# Patient Record
Sex: Male | Born: 1970 | Race: White | Hispanic: No | Marital: Single | Smoking: Current every day smoker
Health system: Southern US, Community
[De-identification: ages and names within clinical notes are randomized; demographics above are authoritative.]

## PROBLEM LIST (undated history)

## (undated) DIAGNOSIS — F419 Anxiety disorder, unspecified: Secondary | ICD-10-CM

## (undated) DIAGNOSIS — R002 Palpitations: Secondary | ICD-10-CM

## (undated) HISTORY — DX: Anxiety disorder, unspecified: F41.9

## (undated) HISTORY — PX: FRACTURE SURGERY: SHX138

## (undated) HISTORY — PX: NO PAST SURGERIES: SHX2092

---

## 2004-03-16 ENCOUNTER — Emergency Department (HOSPITAL_COMMUNITY): Admission: EM | Admit: 2004-03-16 | Discharge: 2004-03-16 | Payer: Self-pay | Admitting: Emergency Medicine

## 2010-08-18 ENCOUNTER — Other Ambulatory Visit: Payer: Self-pay | Admitting: Emergency Medicine

## 2010-08-18 ENCOUNTER — Emergency Department (HOSPITAL_COMMUNITY)
Admission: EM | Admit: 2010-08-18 | Discharge: 2010-08-18 | Disposition: A | Discharge status: Home / Self Care | Payer: Self-pay | Attending: Emergency Medicine | Admitting: Emergency Medicine

## 2010-08-18 DIAGNOSIS — F1010 Alcohol abuse, uncomplicated: Secondary | ICD-10-CM | POA: Insufficient documentation

## 2010-08-18 LAB — CBC
HCT: 44.4 % (ref 39.0–52.0)
Hemoglobin: 15.1 g/dL (ref 13.0–17.0)
MCH: 30.8 pg (ref 26.0–34.0)
MCHC: 34 g/dL (ref 30.0–36.0)
MCV: 90.6 fL (ref 78.0–100.0)
RDW: 12.7 % (ref 11.5–15.5)

## 2010-08-18 LAB — DIFFERENTIAL
Basophils Absolute: 0 10*3/uL (ref 0.0–0.1)
Eosinophils Relative: 1 % (ref 0–5)
Lymphocytes Relative: 22 % (ref 12–46)
Lymphs Abs: 2.7 10*3/uL (ref 0.7–4.0)
Monocytes Absolute: 0.8 10*3/uL (ref 0.1–1.0)
Monocytes Relative: 6 % (ref 3–12)

## 2010-08-18 LAB — COMPREHENSIVE METABOLIC PANEL
BUN: 13 mg/dL (ref 6–23)
CO2: 24 mEq/L (ref 19–32)
Calcium: 8.2 mg/dL — ABNORMAL LOW (ref 8.4–10.5)
Chloride: 104 mEq/L (ref 96–112)
Creatinine, Ser: 0.98 mg/dL (ref 0.4–1.5)
GFR calc non Af Amer: >60 mL/min (ref >60)
Glucose, Bld: 110 mg/dL — ABNORMAL HIGH (ref 70–99)
Total Bilirubin: 0.9 mg/dL (ref 0.3–1.2)

## 2011-02-17 ENCOUNTER — Emergency Department (HOSPITAL_COMMUNITY)
Admission: EM | Admit: 2011-02-17 | Discharge: 2011-02-17 | Disposition: A | Discharge status: Home / Self Care | Payer: Self-pay | Attending: Emergency Medicine | Admitting: Emergency Medicine

## 2011-02-17 DIAGNOSIS — S61509A Unspecified open wound of unspecified wrist, initial encounter: Secondary | ICD-10-CM | POA: Insufficient documentation

## 2011-02-17 DIAGNOSIS — W64XXXA Exposure to other animate mechanical forces, initial encounter: Secondary | ICD-10-CM | POA: Insufficient documentation

## 2011-02-17 DIAGNOSIS — IMO0002 Reserved for concepts with insufficient information to code with codable children: Secondary | ICD-10-CM | POA: Insufficient documentation

## 2012-08-16 ENCOUNTER — Encounter (HOSPITAL_COMMUNITY): Payer: Self-pay | Admitting: Emergency Medicine

## 2012-08-16 ENCOUNTER — Other Ambulatory Visit: Payer: Self-pay

## 2012-08-16 ENCOUNTER — Emergency Department (HOSPITAL_COMMUNITY): Payer: 59

## 2012-08-16 ENCOUNTER — Emergency Department (HOSPITAL_COMMUNITY)
Admission: EM | Admit: 2012-08-16 | Discharge: 2012-08-16 | Disposition: A | Discharge status: Home / Self Care | Payer: 59 | Attending: Emergency Medicine | Admitting: Emergency Medicine

## 2012-08-16 DIAGNOSIS — R0789 Other chest pain: Secondary | ICD-10-CM | POA: Insufficient documentation

## 2012-08-16 DIAGNOSIS — R209 Unspecified disturbances of skin sensation: Secondary | ICD-10-CM | POA: Insufficient documentation

## 2012-08-16 DIAGNOSIS — G5600 Carpal tunnel syndrome, unspecified upper limb: Secondary | ICD-10-CM | POA: Insufficient documentation

## 2012-08-16 DIAGNOSIS — F17200 Nicotine dependence, unspecified, uncomplicated: Secondary | ICD-10-CM | POA: Insufficient documentation

## 2012-08-16 DIAGNOSIS — G5603 Carpal tunnel syndrome, bilateral upper limbs: Secondary | ICD-10-CM

## 2012-08-16 LAB — BASIC METABOLIC PANEL
BUN: 19 mg/dL (ref 6–23)
CO2: 24 mEq/L (ref 19–32)
Calcium: 9.4 mg/dL (ref 8.4–10.5)
Chloride: 101 mEq/L (ref 96–112)
Creatinine, Ser: 0.94 mg/dL (ref 0.50–1.35)
Glucose, Bld: 95 mg/dL (ref 70–99)

## 2012-08-16 LAB — POCT I-STAT TROPONIN I: Troponin i, poc: 0 ng/mL (ref 0.00–0.08)

## 2012-08-16 LAB — CBC
HCT: 45.8 % (ref 39.0–52.0)
Hemoglobin: 16.4 g/dL (ref 13.0–17.0)
MCHC: 35.8 g/dL (ref 30.0–36.0)
RDW: 12.6 % (ref 11.5–15.5)
WBC: 7.3 10*3/uL (ref 4.0–10.5)

## 2012-08-16 NOTE — ED Notes (Signed)
Pt sitting at the foot of the stretcher

## 2012-08-16 NOTE — ED Provider Notes (Signed)
History     CSN: [id number]  Arrival date & time 08/16/12  1158   First MD Initiated Contact with Patient 08/16/12 1622      Chief Complaint  Patient presents with  . Chest Pain    (Consider location/radiation/quality/duration/timing/severity/associated sxs/prior treatment) HPI  Andrew Bautista is a 42 y.o. male c/o 3 days of intermittent chest discomfort it is relieved by burping and position change. Patient states that the pain change yesterday into a sharp stabbing pain that was fleeting lasting only a few seconds. Patient states that he has been aspirin with little relief. He is currently chest pain-free, he rates his pain at 6/10 at worst. Associated symptoms of bilateral hand numbness and paresthesia, which he attributes to carpal tunnel. He denies shortness of breath, nausea vomiting, headache, fever, chills, cough, recent travel, leg swelling, diaphoresis, Back pain, syncope, recent cocaine or methamphetamine abuse. Denies history of DVT, PE, recent travel, leg swelling, hemoptysis. Patient had previous similar episodes in December which his counselor attributed to panic attacks secondary to stress. Patient states he is under a lot of stress at the current time he's been quitting smoking,  RF: Tobacco abuse, Cath: ?  Last Stress test: ? Cardiologost: None  PCP: None  History reviewed. No pertinent past medical history.  History reviewed. No pertinent past surgical history.  History reviewed. No pertinent family history.  History  Substance Use Topics  . Smoking status: Current Every Day Smoker  . Smokeless tobacco: Not on file  . Alcohol Use: No      Review of Systems  Constitutional: Negative for fever.  Respiratory: Negative for shortness of breath.   Cardiovascular: Negative for chest pain.  Gastrointestinal: Negative for nausea, vomiting, abdominal pain and diarrhea.  All other systems reviewed and are negative.    Allergies  Review of patient's allergies  indicates no known allergies.  Home Medications  No current outpatient prescriptions on file.  BP 137/88  Pulse 102  Temp(Src) 98.7 F (37.1 C) (Oral)  Resp 18  SpO2 99%  Physical Exam  Nursing note and vitals reviewed. Constitutional: He is oriented to person, place, and time. He appears well-developed and well-nourished. No distress.  HENT:  Head: Normocephalic and atraumatic.  Mouth/Throat: Oropharynx is clear and moist.  Eyes: Conjunctivae and EOM are normal. Pupils are equal, round, and reactive to light.  Neck: Normal range of motion. Neck supple. No JVD present.  Cardiovascular: Normal rate, regular rhythm, normal heart sounds and intact distal pulses.  Exam reveals no gallop and no friction rub.   No murmur heard. Pulmonary/Chest: Effort normal and breath sounds normal. No stridor. No respiratory distress. He has no wheezes. He has no rales. He exhibits no tenderness.  Abdominal: Soft. Bowel sounds are normal. He exhibits no distension and no mass. There is no tenderness. There is no rebound.  Musculoskeletal: Normal range of motion. He exhibits no edema.  No calf asymmetry, superficial collaterals, palpable cords, edema, Homans sign negative bilaterally.   positive bilateral Tinel's sign negative Phalen's  Neurological: He is alert and oriented to person, place, and time.  Psychiatric: He has a normal mood and affect.    ED Course  Procedures (including critical care time)  Labs Reviewed  CBC  BASIC METABOLIC PANEL  POCT I-STAT TROPONIN I  POCT I-STAT TROPONIN I   Dg Chest 2 View  08/16/2012  *RADIOLOGY REPORT*  Clinical Data: Mid chest pain, tingling of the hands intermittently for 6 months  CHEST - 2 VIEW  Comparison: None.  Findings: The lungs are clear.  Minimal peribronchial thickening is noted suggestive of bronchitis and the lungs are somewhat hyperaerated.  Does this patient have a history of asthma?  The heart is within normal limits in size.  No bony  abnormality is seen.  IMPRESSION: Hyperaeration with peribronchial thickening consistent with asthma or bronchitis.  No pneumonia.   Original Report Authenticated By: Dwyane Dee, M.D.     Date: 08/17/2012  Rate: 103  Rhythm: Sinus tachycardia  QRS Axis: normal  Intervals: normal  ST/T Wave abnormalities: normal  Conduction Disutrbances:none  Narrative Interpretation:   Old EKG Reviewed: unchanged   1. Atypical chest pain   2. Carpal tunnel syndrome, bilateral       MDM   Andrew Bautista is a 42 y.o. male was very atypical chest pain. I doubt cardiac source of discomfort.  Also doubt PE.  EKG is nonischemic, troponin is negative, chest x-ray shows no acute abnormality except for hyperaeration which I discussed with the patient is consistent with COPD. Patient has appointment with his primary care physician at the end of the month. Return precautions given   Filed Vitals:   08/16/12 1703 08/16/12 1730 08/16/12 1810 08/16/12 1830  BP: 111/72 110/68 114/71 109/77  Pulse: 80 75 70 69  Temp:      TempSrc:      Resp: 18 19 18 16   SpO2: 98% 99% 97% 99%     Pt verbalized understanding and agrees with care plan. Outpatient follow-up and return precautions given.           Wynetta Emery, PA-C 08/17/12 [phone]

## 2012-08-16 NOTE — ED Notes (Signed)
Pt c/o intermittent left sided CP worse after eating and with belching x several days

## 2012-08-17 NOTE — ED Provider Notes (Signed)
Medical screening examination/treatment/procedure(s) were performed by non-physician practitioner and as supervising physician I was immediately available for consultation/collaboration.   Gwyneth Sprout, MD 08/17/12 2146

## 2012-08-31 ENCOUNTER — Ambulatory Visit: Payer: Self-pay | Admitting: Internal Medicine

## 2012-11-09 ENCOUNTER — Ambulatory Visit: Payer: Self-pay | Admitting: Internal Medicine

## 2012-12-08 ENCOUNTER — Encounter: Payer: Self-pay | Admitting: Internal Medicine

## 2012-12-08 ENCOUNTER — Ambulatory Visit (INDEPENDENT_AMBULATORY_CARE_PROVIDER_SITE_OTHER): Payer: 59 | Admitting: Internal Medicine

## 2012-12-08 VITALS — BP 110/68 | HR 74 | Temp 98.4°F | Ht 69.25 in | Wt 196.8 lb

## 2012-12-08 DIAGNOSIS — Z0000 Encounter for general adult medical examination without abnormal findings: Secondary | ICD-10-CM

## 2012-12-08 LAB — LIPID PANEL
Cholesterol: 162 mg/dL (ref 0–200)
HDL: 43.5 mg/dL (ref >39.00)
Triglycerides: 64 mg/dL (ref 0.0–149.0)
VLDL: 12.8 mg/dL (ref 0.0–40.0)

## 2012-12-08 LAB — HEPATIC FUNCTION PANEL
ALT: 19 U/L (ref 0–53)
AST: 18 U/L (ref 0–37)
Total Bilirubin: 0.8 mg/dL (ref 0.3–1.2)
Total Protein: 6.7 g/dL (ref 6.0–8.3)

## 2012-12-08 NOTE — Patient Instructions (Signed)
Testicular Problems and Self-Exam Men can examine themselves easily and effectively with positive results. Monthly exams detect problems early and save lives. There are numerous causes of swelling in the testicle. Testicular cancer usually appears as a firm painless lump in the front part of the testicle. This may feel like a dull ache or heavy feeling located in the lower abdomen (belly), groin, or scrotum.  The risk is greater in men with undescended testicles and it is more common in young men. It is responsible for almost a fifth of cancers in males between ages 52 and 76. Other common causes of swellings, lumps, and testicular pain include injuries, inflammation (soreness) from infection, hydrocele, and torsion. These are a few of the reasons to do monthly self-examination of the testicles. The exam only takes minutes and could add years to your life. Get in the habit! SELF-EXAMINATION OF THE TESTICLES The testicles are easiest to examine after warm baths or showers and are more difficult to examine when you are cold. This is because the muscles attached to the testicles retract and pull them up higher or into the abdomen. While standing, roll one testicle between the thumb and forefinger. Feel for lumps, swelling, or discomfort. A normal testicle is egg shaped and feels firm. It is smooth and not tender. The spermatic cord can be felt as a firm spaghetti-like cord at the back of the testicle. It is also important to examine your groins. This is the crease between the front of your leg and your abdomen. Also, feel for enlarged lymph nodes (glands). Enlarged nodes are also a cause for you to see your caregiver for evaluation.  Self-examination of the testicles and groin areas on a regular basis will help you to know what your own testicles and groins feel like. This will help you pick up an abnormality (difference) at an earlier stage. Early discovery is the key to curing this cancer or treating other  conditions. Any lump, change, or swelling in the testicle calls for immediate evaluation by your caregiver. Cancer of the testicle does not result in impotence and it does not prevent normal intercourse or prevent having children. If your caregiver feels that medical treatment or chemotherapy could lead to infertility, sperm can be frozen for future use. It is necessary to see a caregiver as soon as possible after the discovery of a lump in a testicle. Document Released: 07/28/2000 Document Revised: 07/14/2011 Document Reviewed: 04/22/2008 [facility] Patient Information 2014 [place], [place].    Smoking Cessation Quitting smoking is important to your health and has many advantages. However, it is not always easy to quit since nicotine is a very addictive drug. Often times, people try 3 times or more before being able to quit. This document explains the best ways for you to prepare to quit smoking. Quitting takes hard work and a lot of effort, but you can do it. ADVANTAGES OF QUITTING SMOKING  You will live longer, feel better, and live better.  Your body will feel the impact of quitting smoking almost immediately.  Within 20 minutes, blood pressure decreases. Your pulse returns to its normal level.  After 8 hours, carbon monoxide levels in the blood return to normal. Your oxygen level increases.  After 24 hours, the chance of having a heart attack starts to decrease. Your breath, hair, and body stop smelling like smoke.  After 48 hours, damaged nerve endings begin to recover. Your sense of taste and smell improve.  After 72 hours, the body is virtually  free of nicotine. Your bronchial tubes relax and breathing becomes easier.  After 2 to 12 weeks, lungs can hold more air. Exercise becomes easier and circulation improves.  The risk of having a heart attack, stroke, cancer, or lung disease is greatly reduced.  After 1 year, the risk of coronary heart disease is cut in half.  After 5 years,  the risk of stroke falls to the same as a nonsmoker.  After 10 years, the risk of lung cancer is cut in half and the risk of other cancers decreases significantly.  After 15 years, the risk of coronary heart disease drops, usually to the level of a nonsmoker.  If you are pregnant, quitting smoking will improve your chances of having a healthy baby.  The people you live with, especially any children, will be healthier.  You will have extra money to spend on things other than cigarettes. QUESTIONS TO THINK ABOUT BEFORE ATTEMPTING TO QUIT You may want to talk about your answers with your caregiver.  Why do you want to quit?  If you tried to quit in the past, what helped and what did not?  What will be the most difficult situations for you after you quit? How will you plan to handle them?  Who can help you through the tough times? Your family? Friends? A caregiver?  What pleasures do you get from smoking? What ways can you still get pleasure if you quit? Here are some questions to ask your caregiver:  How can you help me to be successful at quitting?  What medicine do you think would be best for me and how should I take it?  What should I do if I need more help?  What is smoking withdrawal like? How can I get information on withdrawal? GET READY  Set a quit date.  Change your environment by getting rid of all cigarettes, ashtrays, matches, and lighters in your home, car, or work. Do not let people smoke in your home.  Review your past attempts to quit. Think about what worked and what did not. GET SUPPORT AND ENCOURAGEMENT You have a better chance of being successful if you have help. You can get support in many ways.  Tell your family, friends, and co-workers that you are going to quit and need their support. Ask them not to smoke around you.  Get individual, group, or telephone counseling and support. Programs are available at [employer] and health centers. Call your  local health department for information about programs in your area.  Spiritual beliefs and practices may help some smokers quit.  Download a "quit meter" on your computer to keep track of quit statistics, such as how long you have gone without smoking, cigarettes not smoked, and money saved.  Get a self-help book about quitting smoking and staying off of tobacco. LEARN NEW SKILLS AND BEHAVIORS  Distract yourself from urges to smoke. Talk to someone, go for a walk, or occupy your time with a task.  Change your normal routine. Take a different route to work. Drink tea instead of coffee. Eat breakfast in a different place.  Reduce your stress. Take a hot bath, exercise, or read a book.  Plan something enjoyable to do every day. Reward yourself for not smoking.  Explore interactive web-based programs that specialize in helping you quit. GET MEDICINE AND USE IT CORRECTLY Medicines can help you stop smoking and decrease the urge to smoke. Combining medicine with the above behavioral methods and support can greatly  increase your chances of successfully quitting smoking.  Nicotine replacement therapy helps deliver nicotine to your body without the negative effects and risks of smoking. Nicotine replacement therapy includes nicotine gum, lozenges, inhalers, nasal sprays, and skin patches. Some may be available over-the-counter and others require a prescription.  Antidepressant medicine helps people abstain from smoking, but how this works is unknown. This medicine is available by prescription.  Nicotinic receptor partial agonist medicine simulates the effect of nicotine in your brain. This medicine is available by prescription. Ask your caregiver for advice about which medicines to use and how to use them based on your health history. Your caregiver will tell you what side effects to look out for if you choose to be on a medicine or therapy. Carefully read the information on the package. Do not use  any other product containing nicotine while using a nicotine replacement product.  RELAPSE OR DIFFICULT SITUATIONS Most relapses occur within the first 3 months after quitting. Do not be discouraged if you start smoking again. Remember, most people try several times before finally quitting. You may have symptoms of withdrawal because your body is used to nicotine. You may crave cigarettes, be irritable, feel very hungry, cough often, get headaches, or have difficulty concentrating. The withdrawal symptoms are only temporary. They are strongest when you first quit, but they will go away within 10 14 days. To reduce the chances of relapse, try to:  Avoid drinking alcohol. Drinking lowers your chances of successfully quitting.  Reduce the amount of caffeine you consume. Once you quit smoking, the amount of caffeine in your body increases and can give you symptoms, such as a rapid heartbeat, sweating, and anxiety.  Avoid smokers because they can make you want to smoke.  Do not let weight gain distract you. Many smokers will gain weight when they quit, usually less than 10 pounds. Eat a healthy diet and stay active. You can always lose the weight gained after you quit.  Find ways to improve your mood other than smoking. FOR MORE INFORMATION  www.smokefree.gov  Document Released: 04/15/2001 Document Revised: 10/21/2011 Document Reviewed: 07/31/2011 [facility] Patient Information 2014 [place], [place].
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
## 2012-12-08 NOTE — Assessment & Plan Note (Addendum)
tdap ~ 2012 Never had a cscope  Labs Continue healthy life style STE Tobacco-- risks discussed. In the past apparently tried Wellbutrin and couldn't tolerate it, developed a rash with the patch, other alternatives discussed . Info about quitting provided. Other  issues --- CP, went to the ER, w/u neg, no reocurrence, sx believed to be d/t stress , [facility]-- since age 42, no anemia, rec observation RTC 1 year and as needed

## 2012-12-09 ENCOUNTER — Encounter: Payer: Self-pay | Admitting: Internal Medicine

## 2012-12-13 ENCOUNTER — Encounter: Payer: Self-pay | Admitting: *Deleted

## 2013-01-18 ENCOUNTER — Ambulatory Visit: Payer: 59 | Admitting: Internal Medicine

## 2013-10-27 ENCOUNTER — Encounter (HOSPITAL_COMMUNITY): Payer: Self-pay | Admitting: Emergency Medicine

## 2013-10-27 ENCOUNTER — Emergency Department (HOSPITAL_COMMUNITY)
Admission: EM | Admit: 2013-10-27 | Discharge: 2013-10-28 | Disposition: A | Discharge status: Home / Self Care | Payer: 59 | Attending: Emergency Medicine | Admitting: Emergency Medicine

## 2013-10-27 ENCOUNTER — Emergency Department (HOSPITAL_COMMUNITY): Payer: 59

## 2013-10-27 DIAGNOSIS — IMO0002 Reserved for concepts with insufficient information to code with codable children: Secondary | ICD-10-CM | POA: Insufficient documentation

## 2013-10-27 DIAGNOSIS — W11XXXA Fall on and from ladder, initial encounter: Secondary | ICD-10-CM | POA: Insufficient documentation

## 2013-10-27 DIAGNOSIS — Y929 Unspecified place or not applicable: Secondary | ICD-10-CM | POA: Insufficient documentation

## 2013-10-27 DIAGNOSIS — S2231XA Fracture of one rib, right side, initial encounter for closed fracture: Secondary | ICD-10-CM

## 2013-10-27 DIAGNOSIS — F17200 Nicotine dependence, unspecified, uncomplicated: Secondary | ICD-10-CM | POA: Insufficient documentation

## 2013-10-27 DIAGNOSIS — Z8659 Personal history of other mental and behavioral disorders: Secondary | ICD-10-CM | POA: Insufficient documentation

## 2013-10-27 DIAGNOSIS — Y9389 Activity, other specified: Secondary | ICD-10-CM | POA: Insufficient documentation

## 2013-10-27 DIAGNOSIS — S2239XA Fracture of one rib, unspecified side, initial encounter for closed fracture: Secondary | ICD-10-CM | POA: Insufficient documentation

## 2013-10-27 DIAGNOSIS — Z79899 Other long term (current) drug therapy: Secondary | ICD-10-CM | POA: Insufficient documentation

## 2013-10-27 DIAGNOSIS — Z7982 Long term (current) use of aspirin: Secondary | ICD-10-CM | POA: Insufficient documentation

## 2013-10-27 NOTE — ED Notes (Signed)
Patient states he was on a 4' ladder on the top rung of the ladder trimming trees. Patient states the ladder turned under him while his R arm was outstretched and he landed flat on his right side. Patient reports a prior history of rib fractures but this feels different. Patient states he is unable to sit. Patient is ambulatory in triage. Patient also c/o L lower rib pain when he turns his body.

## 2013-10-28 MED ORDER — OXYCODONE-ACETAMINOPHEN 5-325 MG PO TABS: 1.0000 | ORAL_TABLET | Freq: Four times a day (QID) | ORAL | Status: DC | PRN

## 2013-10-28 MED ORDER — KETOROLAC TROMETHAMINE 30 MG/ML IJ SOLN
INTRAMUSCULAR | Status: AC
Start: 2013-10-28 — End: 2013-10-28
  Filled 2013-10-28: qty 1

## 2013-10-28 MED ORDER — KETOROLAC TROMETHAMINE 30 MG/ML IJ SOLN
60.0000 mg | Freq: Once | INTRAMUSCULAR | Status: AC
  Administered 2013-10-28: 60 mg via INTRAMUSCULAR
  Filled 2013-10-28: qty 2

## 2013-10-28 NOTE — ED Notes (Signed)
Pt states fell on R side from a 4 foot ladder this evening, states having R ribcage pain, states when he walks he feels short of breath. Pt states hard for him to lay down d/t twisting abdominal muscles which makes pain worse.

## 2013-10-28 NOTE — ED Notes (Signed)
Incentive Spirometry given to pt and demonstrated by pt

## 2013-10-28 NOTE — Discharge Instructions (Signed)
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
## 2014-06-22 ENCOUNTER — Other Ambulatory Visit: Payer: Self-pay

## 2014-06-22 ENCOUNTER — Encounter (HOSPITAL_COMMUNITY): Payer: Self-pay | Admitting: Emergency Medicine

## 2014-06-22 ENCOUNTER — Emergency Department (HOSPITAL_COMMUNITY): Payer: Self-pay

## 2014-06-22 ENCOUNTER — Emergency Department (HOSPITAL_COMMUNITY)
Admission: EM | Admit: 2014-06-22 | Discharge: 2014-06-23 | Disposition: A | Discharge status: Home / Self Care | Payer: Self-pay | Attending: Emergency Medicine | Admitting: Emergency Medicine

## 2014-06-22 DIAGNOSIS — Z8659 Personal history of other mental and behavioral disorders: Secondary | ICD-10-CM | POA: Insufficient documentation

## 2014-06-22 DIAGNOSIS — R002 Palpitations: Secondary | ICD-10-CM | POA: Insufficient documentation

## 2014-06-22 DIAGNOSIS — Z79899 Other long term (current) drug therapy: Secondary | ICD-10-CM | POA: Insufficient documentation

## 2014-06-22 DIAGNOSIS — Z7982 Long term (current) use of aspirin: Secondary | ICD-10-CM | POA: Insufficient documentation

## 2014-06-22 DIAGNOSIS — R05 Cough: Secondary | ICD-10-CM | POA: Insufficient documentation

## 2014-06-22 DIAGNOSIS — Z720 Tobacco use: Secondary | ICD-10-CM | POA: Insufficient documentation

## 2014-06-22 HISTORY — DX: Palpitations: R00.2

## 2014-06-22 LAB — I-STAT CHEM 8, ED
BUN: 13 mg/dL (ref 6–23)
CALCIUM ION: 1.12 mmol/L (ref 1.12–1.23)
CREATININE: 0.9 mg/dL (ref 0.50–1.35)
Chloride: 103 mmol/L (ref 96–112)
GLUCOSE: 122 mg/dL — AB (ref 70–99)
HCT: 53 % — ABNORMAL HIGH (ref 39.0–52.0)
Hemoglobin: 18 g/dL — ABNORMAL HIGH (ref 13.0–17.0)
Potassium: 3.3 mmol/L — ABNORMAL LOW (ref 3.5–5.1)
Sodium: 140 mmol/L (ref 135–145)
TCO2: 22 mmol/L (ref 0–100)

## 2014-06-22 LAB — CBC WITH DIFFERENTIAL/PLATELET
BASOS ABS: 0 10*3/uL (ref 0.0–0.1)
BASOS PCT: 0 % (ref 0–1)
EOS ABS: 0.3 10*3/uL (ref 0.0–0.7)
EOS PCT: 3 % (ref 0–5)
HEMATOCRIT: 49.1 % (ref 39.0–52.0)
Hemoglobin: 16.5 g/dL (ref 13.0–17.0)
Lymphocytes Relative: 43 % (ref 12–46)
Lymphs Abs: 5 10*3/uL — ABNORMAL HIGH (ref 0.7–4.0)
MCH: 30.8 pg (ref 26.0–34.0)
MCHC: 33.6 g/dL (ref 30.0–36.0)
MCV: 91.6 fL (ref 78.0–100.0)
MONO ABS: 0.7 10*3/uL (ref 0.1–1.0)
Monocytes Relative: 6 % (ref 3–12)
NEUTROS ABS: 5.5 10*3/uL (ref 1.7–7.7)
Neutrophils Relative %: 48 % (ref 43–77)
Platelets: 309 10*3/uL (ref 150–400)
RBC: 5.36 MIL/uL (ref 4.22–5.81)
RDW: 13.3 % (ref 11.5–15.5)
WBC: 11.6 10*3/uL — ABNORMAL HIGH (ref 4.0–10.5)

## 2014-06-22 LAB — I-STAT TROPONIN, ED: Troponin i, poc: 0 ng/mL (ref 0.00–0.08)

## 2014-06-22 MED ORDER — SODIUM CHLORIDE 0.9 % IV BOLUS (SEPSIS)
1000.0000 mL | Freq: Once | INTRAVENOUS | Status: AC
  Administered 2014-06-22: 1000 mL via INTRAVENOUS

## 2014-06-22 NOTE — ED Notes (Signed)
HR 80s to 90s , pt denied CP and or discomfort of any kind
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
## 2014-06-22 NOTE — ED Notes (Signed)
Pt reports he has had heart palpitations approximately 1.5 years ago. Had chest xray completed and was told the only abnormality was that his lungs, "looked a little dark and that I needed to stop smoking." Today, reports he is getting over a cold and had a violent coughing spell. After this he felt "like he was having triple beats." Reports chest pain earlier in the year but found out it was GERD. Taking Prilosec and this alleviated the chest pain. No SOB or dizziness with palpitations. Also says at night when he falls asleep both of his arms will get numb and tingly. RR even/unlabored. Ambulatory with steady gait.

## 2014-06-23 ENCOUNTER — Encounter (HOSPITAL_COMMUNITY): Payer: Self-pay | Admitting: Emergency Medicine

## 2014-06-23 LAB — D-DIMER, QUANTITATIVE: D-Dimer, Quant: <0.27 ug/mL-FEU (ref 0.00–0.48)

## 2014-06-23 MED ORDER — POTASSIUM CHLORIDE CRYS ER 20 MEQ PO TBCR
40.0000 meq | EXTENDED_RELEASE_TABLET | Freq: Once | ORAL | Status: AC
  Administered 2014-06-23: 40 meq via ORAL
  Filled 2014-06-23: qty 2

## 2014-06-23 NOTE — Discharge Instructions (Signed)
°Emergency Department Resource Guide °1) Find a Doctor and Pay Out of Pocket °Although you won't have to find out who is covered by your insurance plan, it is a good idea to ask around and get recommendations. You will then need to call the office and see if the doctor you have chosen will accept you as a new patient and what types of options they offer for patients who are self-pay. Some doctors offer discounts or will set up payment plans for their patients who do not have insurance, but you will need to ask so you aren't surprised when you get to your appointment. ° °2) Contact Your Local Health Department °Not all health departments have doctors that can see patients for sick visits, but many do, so it is worth a call to see if yours does. If you don't know where your local health department is, you can check in your phone book. The CDC also has a tool to help you locate your state's health department, and many state websites also have listings of all of their local health departments. ° °3) Find a Walk-in Clinic °If your illness is not likely to be very severe or complicated, you may want to try a walk in clinic. These are popping up all over the country in pharmacies, drugstores, and shopping centers. They're usually staffed by nurse practitioners or physician assistants that have been trained to treat common illnesses and complaints. They're usually fairly quick and inexpensive. However, if you have serious medical issues or chronic medical problems, these are probably not your best option. ° °No Primary Care Doctor: °- Call [facility] at  832-8000 - they can help you locate a primary care doctor that  accepts your insurance, provides certain services, etc. °- Physician Referral Service- 1-800-533-3463 ° °Chronic Pain Problems: °Organization         Address  Phone   Notes  °[facility]  (336) 297-2271 Patients need to be referred by their primary care doctor.  ° °Medication  Assistance: °Organization         Address  Phone   Notes  °[place] [facility] [address] °[place], [place] [address] (336) 641-8030 --Must be a resident of [place] °-- Must have NO insurance coverage whatsoever (no Medicaid/ Medicare, etc.) °-- The pt. MUST have a primary care doctor that directs their care regularly and follows them in the community °  °[facility]  (866) 331-1348   °[employer]  (888) 892-1162   ° °Agencies that provide inexpensive medical care: °Organization         Address  Phone   Notes  °[facility]  (336) 832-8035   °[facility]    (336) 832-7272   °[facility] [address] °[place], [place] [address] (336) 832-4777   °[facility] [address], °[place] (336) 271-4999   °[facility]    (336) 373-0678   °[facility]    (336) 272-1050   °[facility] ° [address], [place] Phone:  (336) 832-4444, Fax:  (336) 832-4440 Hours of Operation:  9 am - 6 pm, M-F.  Also accepts Medicaid/Medicare and self-pay.  °[facility] ° [address], [place] Phone: (336) 832-3150, Fax: (336) 832-3151. Hours of Operation:  8:30 am - 5:30 pm, M-F.  Also accepts Medicaid and self-pay.  °[facility] [place] [address], [place] Phone: (336) 878-6027   °[facility] [address], [place], [place] (336)723-1848, Ext. 123 Mondays & Thursdays: 7-9 AM.  First 15 patients are seen on a first come, first serve basis. °  ° °Medicaid-accepting [place] Providers: ° °Organization         Address  Phone   Notes  °[facility] [address], [place] (336) 641-2100 Also accepts self-pay patients.  °[facility] [address], [place] ° (336) 856-9996   °[facility] [address], [place]  (336) 288-8857   °[facility] [address] [place] [address], [place] (336) 299-7000   °Veita Bland [address], [place]  ° (336) 373-1557 Only accepts [place] Access Medicaid patients after they have their name applied to their card.  ° °Self-Pay (no insurance) in [place]: ° °Organization         Address  Phone   Notes  °Sickle Cell Patients, [facility] [address], [place] (336) 832-1970   °[facility] [address], [place] (336) 832-4400   °[facility] ° [address], [place] (336) 992-4800   °[facility]/Dr. Osei-Bonsu ° [address] [place] [address], [place] or [address], [place] (336) 841-8500 Phone number for both [place] and [place] locations is the same.  °[facility] [address], [place] (336) 299-0000   °[facility] [address] [place] [address], [place] or [address] (336) 852-7530 °(336) 878-2260   °[facility] [address], [place] (336) 350-1642, phone; (336) 294-5005, fax Sees patients 1st and 3rd Saturday of every month.  Must not qualify for public or private insurance (i.e. Medicaid, Medicare, [place] Health Choice, Veterans' Benefits) • Household income should be no more than 200% of the poverty level •The clinic cannot treat you if you are pregnant or think you are pregnant • Sexually transmitted diseases are not treated at the clinic.  ° ° °Dental Care: °Organization         Address  Phone  Notes  °[place] [facility] [address], [place] (336) 641-6152 Accepts children up to age 21 who are enrolled in Medicaid or [place] Health Choice; pregnant women with a Medicaid card; and children who have applied for Medicaid or [place] Health Choice, but were declined, whose parents can pay a reduced fee at time of service.  °[place]  [facility] [place]  [address], [place] (336) 641-7733 Accepts children up to age 21 who are enrolled in Medicaid or  Health Choice; pregnant women with a Medicaid card; and children who have applied for Medicaid or  Health Choice, but were declined, whose parents can pay a reduced fee at time of service.  °[facility] ° [address],  (336) 641-4533 Patients are seen by appointment only. Walk-ins are not accepted. [facility] will see patients 18 years of age and older. °Monday - Tuesday (8am-5pm) °Most Wednesdays (8:30-5pm) °$30 per visit, cash only  °[facility] ° [address], [place] (336) 641-4533 Patients are seen by appointment only. Walk-ins are not accepted. [facility] will see patients 18 years of age and older. °One   Wednesday Evening (Monthly: Volunteer Based).  $30 per visit, cash only  °[facility]  (919) 537-3737 for adults; Children under age 4, call Graduate Pediatric Dentistry at (919) 537-3956. Children aged 4-14, please call (919) 537-3737 to request a pediatric application. ° Dental services are provided in all areas of dental care including fillings, crowns and bridges, complete and partial dentures, implants, gum treatment, root canals, and extractions. Preventive care is also provided. Treatment is provided to both adults and children. °Patients are selected via a lottery and there is often a waiting list. °  °[facility] [address], °[place] ° (336) 763-8833 [email or web address] °  °[facility] [address], [place], [place] (336)723-1848, Ext. 123 Second and Fourth Thursday of each month, opens at 6:30 AM; Clinic ends at 9 AM.  Patients are seen on a first-come first-served basis, and a limited number are seen during each clinic.  ° °[facility] ° [address], [place], [place] (336) 723-7904    Eligibility Requirements °You must have lived in [place], [place], or [place] counties for at least the last three months. °  You cannot be eligible for state or federal sponsored healthcare insurance, including Veterans Administration, Medicaid, or Medicare. °  You generally cannot be eligible for healthcare insurance through your employer.  °  How to apply: °Eligibility screenings are held every Tuesday and Wednesday afternoon from 1:00 pm until 4:00 pm. You do not need an appointment for the interview!  °[facility] [address], [place], [place] 336-631-2330   °[facility] [facility]  336-342-8273   °[facility]  336-703-3100   °[facility]  336-570-6415   ° °Behavioral Health Resources in the Community: °Intensive Outpatient Programs °Organization         Address  Phone  Notes  °[facility] [address], [place], [place] 336-878-6098   °[facility] [address], [place], [place] 336-832-9800   °[facility] [address], [place], [place] ° 336-882-2125   °[facility] [address],  °[place], [place] 1-800-853-5163 or 336-641-4981   °Substance Abuse Resources °Organization         Address  Phone  Notes  °[facility]  336-882-2125   °[facility]  336-784-9470   °[facility]  336-285-9073   °[facility]  336-845-3988   °Residential & Outpatient Substance Abuse Program  1-800-659-3381   °Psychological Services °Organization         Address  Phone  Notes  °[facility]  336- 832-9600   °[facility]  336- 378-7881   °[facility] [address], [place] 1-800-853-5163 or 336-641-4981   ° °Mobile Crisis Teams °Organization         Address  Phone  Notes  °[facility], Mobile Crisis Care Unit  1-877-626-1772   °Assertive °[facility] ° [address]  [place], [place] 336-834-9664   °Sharon DeEsch [address] °[place] 336-554-5454   ° °Self-Help/Support Groups °Organization         Address  Phone             Notes  °[facility]  - variety of support groups  336- 373-1402 Call for more information  °Narcotics Anonymous (NA), [facility] [address], °[place]  2 meetings at this location  ° °  Residential Treatment Programs °Organization         Address  Phone  Notes  °[facility] [address],    °[place]  1-866-801-8205   °[facility] ° [address], [place], [place] 704-293-8524   °[facility] [address], [place] 336-845-3988 Admissions: 8am-3pm M-F  °[facility] [address]    °[place], [place] 336-841-1104   °[facility] [address], [place], [place] 336-379-7146   °[facility] [address]  °[place], [place] 336-285-9073   °[facility] - Intensive Outpatient [address], [place], [place] 336-852-3033   °[facility] [address]  °[place], [place] 1-877-615-2722 or 336-784-9470   °[facility] ([facility]) [address] , [place] 336-227-7417 Accepts Medicaid  °[facility] [address]  °[place] 1-800-659-3381 Substance Abuse/Addiction Treatment  ° °[place] Behavioral Health Resources °Organization         Address  Phone  Notes  °[facility]  (888) 581-9988   °Julie Brannon, PhD [address], [place]   (336) 349-5553 or (336) 951-0000   °[facility]   [address] °[place], [place] (336) 349-4454   °[facility] [address], [place], [place] (336) 342-8316 Insurance/Medicaid/sponsorship through [facility]  °[facility] [address]                                    [place], [place] (336) 342-8316 Therapy/tele-psych/case    °[facility] [address]  ° [place], [place] (336) 349-2233    °Dr. Arfeen  (336) 349-4544   °[facility] [place]  [employer] [place] [facility] 1) [address], [place] °2) [address], [place] °3)  [address], [place] (336) 349-3220 °(336) 342-7768 ° °(336) 342-8140   °[place] [facility] (336) 342-1394 or (336) 342-3537 (After Hours)    ° ° °

## 2014-11-03 ENCOUNTER — Emergency Department (HOSPITAL_COMMUNITY)
Admission: EM | Admit: 2014-11-03 | Discharge: 2014-11-03 | Disposition: A | Discharge status: Home / Self Care | Payer: Self-pay | Attending: Emergency Medicine | Admitting: Emergency Medicine

## 2014-11-03 ENCOUNTER — Encounter (HOSPITAL_COMMUNITY): Payer: Self-pay | Admitting: Emergency Medicine

## 2014-11-03 DIAGNOSIS — Z7982 Long term (current) use of aspirin: Secondary | ICD-10-CM | POA: Insufficient documentation

## 2014-11-03 DIAGNOSIS — Z720 Tobacco use: Secondary | ICD-10-CM | POA: Insufficient documentation

## 2014-11-03 DIAGNOSIS — I471 Supraventricular tachycardia: Secondary | ICD-10-CM | POA: Insufficient documentation

## 2014-11-03 DIAGNOSIS — Z8659 Personal history of other mental and behavioral disorders: Secondary | ICD-10-CM | POA: Insufficient documentation

## 2014-11-03 DIAGNOSIS — Z79899 Other long term (current) drug therapy: Secondary | ICD-10-CM | POA: Insufficient documentation

## 2014-11-03 LAB — I-STAT CHEM 8, ED
BUN: 9 mg/dL (ref 6–20)
Calcium, Ion: 1.11 mmol/L — ABNORMAL LOW (ref 1.12–1.23)
Chloride: 102 mmol/L (ref 101–111)
Creatinine, Ser: 1 mg/dL (ref 0.61–1.24)
Glucose, Bld: 92 mg/dL (ref 65–99)
HCT: 61 % — ABNORMAL HIGH (ref 39.0–52.0)
HEMOGLOBIN: 20.7 g/dL — AB (ref 13.0–17.0)
POTASSIUM: 4 mmol/L (ref 3.5–5.1)
Sodium: 141 mmol/L (ref 135–145)
TCO2: 22 mmol/L (ref 0–100)

## 2014-11-03 MED ORDER — METOPROLOL SUCCINATE ER 25 MG PO TB24: 12.5000 mg | ORAL_TABLET | Freq: Every day | ORAL | Status: DC

## 2014-11-03 NOTE — ED Notes (Signed)
[place] and began feeling his heartbeat increasing. States he's had this happen before and did not follow up with cardiology, did not stop smoking or get his potassium checked. Denies any chest pain/SOB. Pt presents sweating and pale.

## 2014-11-03 NOTE — Discharge Instructions (Signed)
Supraventricular Tachycardia Drink at least six 8 ounce glasses of water daily. Avoid alcohol. Ask your primary care physician to help you to stop smoking. Call [facility] today to arrange to be seen within one month regarding your tachycardia. Return if you feel worse for any reason. Supraventricular tachycardia (SVT) is when the heart beats very fast. SVT can last for a long time (sustained) or it can start and stop suddenly (nonsustained). HOME CARE   Take your heart medicine as told by your doctor. Check with your doctor before taking cold, diet, or herbal medicine.  Do not smoke.  Do not drink large amounts of caffeine.Caffeine is found in coffee, tea, soda (pop, cola), and chocolate.  Keep all doctor visits as told. GET HELP RIGHT AWAY IF:   You have chest pain or pressure.  You cannot catch your breath.  You are dizzy or lightheaded.  You feel like you will pass out (faint).  You are sweaty (diaphoretic) and feel sick to your stomach (nauseous) or throw up (vomit).  If you have the above problems, call your local emergency services (911 in [place] right away. Do not drive yourself to the hospital. MAKE SURE YOU:   Understand these instructions.  Will watch your condition.  Will get help right away if you are not doing well or get worse. Document Released: 04/21/2005 Document Revised: 07/14/2011 Document Reviewed: 07/26/2008 [facility] Patient Information 2015 [employer] [place]. This information is not intended to replace advice given to you by your health care provider. Make sure you discuss any questions you have with your health care provider.

## 2014-11-03 NOTE — ED Notes (Signed)
MD at bedside, pt attempted vagal maneuver by bearing down as though having a BM and dropped HR down to 114 at this time
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
## 2014-11-09 ENCOUNTER — Encounter: Payer: Self-pay | Admitting: Internal Medicine

## 2014-11-09 ENCOUNTER — Ambulatory Visit (INDEPENDENT_AMBULATORY_CARE_PROVIDER_SITE_OTHER): Payer: Self-pay | Admitting: Internal Medicine

## 2014-11-09 VITALS — BP 132/78 | HR 94 | Ht 68.0 in | Wt 194.2 lb

## 2014-11-09 DIAGNOSIS — Z720 Tobacco use: Secondary | ICD-10-CM

## 2014-11-09 DIAGNOSIS — I471 Supraventricular tachycardia, unspecified: Secondary | ICD-10-CM | POA: Insufficient documentation

## 2014-11-09 MED ORDER — METOPROLOL SUCCINATE ER 25 MG PO TB24: 12.5000 mg | ORAL_TABLET | Freq: Every day | ORAL | Status: DC

## 2014-11-09 MED ORDER — METOPROLOL SUCCINATE ER 25 MG PO TB24: 12.5000 mg | ORAL_TABLET | Freq: Every day | ORAL | Status: AC

## 2014-11-09 NOTE — Patient Instructions (Addendum)
Medication Instructions:  Your physician recommends that you continue on your current medications as directed. Please refer to the Current Medication list given to you today.  Labwork: None ordered  Testing/Procedures: None ordered  Follow-Up: Your physician wants you to follow-up in: 6 months with Dr. Taylor. You will receive a reminder letter in the mail two months in advance. If you don't receive a letter, please call our office to schedule the follow-up appointment.   Any Other Special Instructions Will Be Listed Below (If Applicable). Thank you for choosing  [facility]!!        

## 2014-11-09 NOTE — Assessment & Plan Note (Signed)
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 I have recommended he continue his current medications, alter his lifestyle by reducing his ETOH intake, stop smoking and getting plenty of sleep. He will continue his beta blocker for now. If he has breakthrough symptoms then he will consider catheter ablation.

## 2014-11-09 NOTE — Assessment & Plan Note (Signed)
He has been encouraged to stop smoking.
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
## 2014-11-25 ENCOUNTER — Encounter (HOSPITAL_COMMUNITY): Payer: Self-pay | Admitting: Emergency Medicine

## 2014-11-25 ENCOUNTER — Emergency Department (HOSPITAL_COMMUNITY): Payer: Self-pay

## 2014-11-25 ENCOUNTER — Emergency Department (HOSPITAL_COMMUNITY)
Admission: EM | Admit: 2014-11-25 | Discharge: 2014-11-25 | Disposition: A | Discharge status: Home / Self Care | Payer: Self-pay | Attending: Emergency Medicine | Admitting: Emergency Medicine

## 2014-11-25 DIAGNOSIS — R0789 Other chest pain: Secondary | ICD-10-CM | POA: Insufficient documentation

## 2014-11-25 DIAGNOSIS — Z720 Tobacco use: Secondary | ICD-10-CM | POA: Insufficient documentation

## 2014-11-25 DIAGNOSIS — Z8659 Personal history of other mental and behavioral disorders: Secondary | ICD-10-CM | POA: Insufficient documentation

## 2014-11-25 DIAGNOSIS — Z79899 Other long term (current) drug therapy: Secondary | ICD-10-CM | POA: Insufficient documentation

## 2014-11-25 DIAGNOSIS — Z7982 Long term (current) use of aspirin: Secondary | ICD-10-CM | POA: Insufficient documentation

## 2014-11-25 LAB — BASIC METABOLIC PANEL
Anion gap: 10 (ref 5–15)
BUN: 12 mg/dL (ref 6–20)
CALCIUM: 9 mg/dL (ref 8.9–10.3)
CO2: 23 mmol/L (ref 22–32)
Chloride: 105 mmol/L (ref 101–111)
Creatinine, Ser: 0.93 mg/dL (ref 0.61–1.24)
Glucose, Bld: 85 mg/dL (ref 65–99)
POTASSIUM: 4 mmol/L (ref 3.5–5.1)
SODIUM: 138 mmol/L (ref 135–145)

## 2014-11-25 LAB — CBC
HCT: 49.9 % (ref 39.0–52.0)
Hemoglobin: 17.1 g/dL — ABNORMAL HIGH (ref 13.0–17.0)
MCH: 31.4 pg (ref 26.0–34.0)
MCHC: 34.3 g/dL (ref 30.0–36.0)
MCV: 91.7 fL (ref 78.0–100.0)
PLATELETS: 244 10*3/uL (ref 150–400)
RBC: 5.44 MIL/uL (ref 4.22–5.81)
RDW: 13 % (ref 11.5–15.5)
WBC: 9.9 10*3/uL (ref 4.0–10.5)

## 2014-11-25 LAB — I-STAT TROPONIN, ED: Troponin i, poc: 0.01 ng/mL (ref 0.00–0.08)
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
## 2014-11-25 NOTE — Discharge Instructions (Signed)
You were evaluated in the ED today for your chest discomfort. There is not appear to be an emergent cause for your symptoms at this time. Your exam, labs, EKG and chest x-ray were all reassuring. It is important for you to follow-up with your PCP or cardiologist for further evaluation management of your symptoms. Return to ED for worsening symptoms.  Chest Wall Pain Chest wall pain is pain in or around the bones and muscles of your chest. It may take up to 6 weeks to get better. It may take longer if you must stay physically active in your work and activities.  CAUSES  Chest wall pain may happen on its own. However, it may be caused by:  A viral illness like the flu.  Injury.  Coughing.  Exercise.  Arthritis.  Fibromyalgia.  Shingles. HOME CARE INSTRUCTIONS   Avoid overtiring physical activity. Try not to strain or perform activities that cause pain. This includes any activities using your chest or your abdominal and side muscles, especially if heavy weights are used.  Put ice on the sore area.  Put ice in a plastic bag.  Place a towel between your skin and the bag.  Leave the ice on for 15-20 minutes per hour while awake for the first 2 days.  Only take over-the-counter or prescription medicines for pain, discomfort, or fever as directed by your caregiver. SEEK IMMEDIATE MEDICAL CARE IF:   Your pain increases, or you are very uncomfortable.  You have a fever.  Your chest pain becomes worse.  You have new, unexplained symptoms.  You have nausea or vomiting.  You feel sweaty or lightheaded.  You have a cough with phlegm (sputum), or you cough up blood. MAKE SURE YOU:   Understand these instructions.  Will watch your condition.  Will get help right away if you are not doing well or get worse. Document Released: 04/21/2005 Document Revised: 07/14/2011 Document Reviewed: 12/16/2010 [facility] Patient Information 2015 [place], [place]. This information is not intended  to replace advice given to you by your health care provider. Make sure you discuss any questions you have with your health care provider.  Chest Pain (Nonspecific) It is often hard to give a specific diagnosis for the cause of chest pain. There is always a chance that your pain could be related to something serious, such as a heart attack or a blood clot in the lungs. You need to follow up with your health care provider for further evaluation. CAUSES   Heartburn.  Pneumonia or bronchitis.  Anxiety or stress.  Inflammation around your heart (pericarditis) or lung (pleuritis or pleurisy).  A blood clot in the lung.  A collapsed lung (pneumothorax). It can develop suddenly on its own (spontaneous pneumothorax) or from trauma to the chest.  Shingles infection (herpes zoster virus). The chest wall is composed of bones, muscles, and cartilage. Any of these can be the source of the pain.  The bones can be bruised by injury.  The muscles or cartilage can be strained by coughing or overwork.  The cartilage can be affected by inflammation and become sore (costochondritis). DIAGNOSIS  Lab tests or other studies may be needed to find the cause of your pain. Your health care provider may have you take a test called an ambulatory electrocardiogram (ECG). An ECG records your heartbeat patterns over a 24-hour period. You may also have other tests, such as:  Transthoracic echocardiogram (TTE). During echocardiography, sound waves are used to evaluate how blood flows through your  heart.  Transesophageal echocardiogram (TEE).  Cardiac monitoring. This allows your health care provider to monitor your heart rate and rhythm in real time.  Holter monitor. This is a portable device that records your heartbeat and can help diagnose heart arrhythmias. It allows your health care provider to track your heart activity for several days, if needed.  Stress tests by exercise or by giving medicine that makes the  heart beat faster. TREATMENT   Treatment depends on what may be causing your chest pain. Treatment may include:  Acid blockers for heartburn.  Anti-inflammatory medicine.  Pain medicine for inflammatory conditions.  Antibiotics if an infection is present.  You may be advised to change lifestyle habits. This includes stopping smoking and avoiding alcohol, caffeine, and chocolate.  You may be advised to keep your head raised (elevated) when sleeping. This reduces the chance of acid going backward from your stomach into your esophagus. Most of the time, nonspecific chest pain will improve within 2-3 days with rest and mild pain medicine.  HOME CARE INSTRUCTIONS   If antibiotics were prescribed, take them as directed. Finish them even if you start to feel better.  For the next few days, avoid physical activities that bring on chest pain. Continue physical activities as directed.  Do not use any tobacco products, including cigarettes, chewing tobacco, or electronic cigarettes.  Avoid drinking alcohol.  Only take medicine as directed by your health care provider.  Follow your health care provider's suggestions for further testing if your chest pain does not go away.  Keep any follow-up appointments you made. If you do not go to an appointment, you could develop lasting (chronic) problems with pain. If there is any problem keeping an appointment, call to reschedule. SEEK MEDICAL CARE IF:   Your chest pain does not go away, even after treatment.  You have a rash with blisters on your chest.  You have a fever. SEEK IMMEDIATE MEDICAL CARE IF:   You have increased chest pain or pain that spreads to your arm, neck, jaw, back, or abdomen.  You have shortness of breath.  You have an increasing cough, or you cough up blood.  You have severe back or abdominal pain.  You feel nauseous or vomit.  You have severe weakness.  You faint.  You have chills. This is an emergency. Do  not wait to see if the pain will go away. Get medical help at once. Call your local emergency services (911 in [place] Do not drive yourself to the hospital. MAKE SURE YOU:   Understand these instructions.  Will watch your condition.  Will get help right away if you are not doing well or get worse. Document Released: 01/29/2005 Document Revised: 04/26/2013 Document Reviewed: 11/25/2007 [facility] Patient Information 2015 [place], [place]. This information is not intended to replace advice given to you by your health care provider. Make sure you discuss any questions you have with your health care provider.
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
## 2015-10-07 IMAGING — CR DG CHEST 2V
2 series · 2 of 2 positions shown · non-contrast
Comparison: PA and lateral chest 10/27/2013.

CLINICAL DATA: Cough and chest pain today.  Smoker.

EXAM:
CHEST  2 VIEW

[w chest pa]
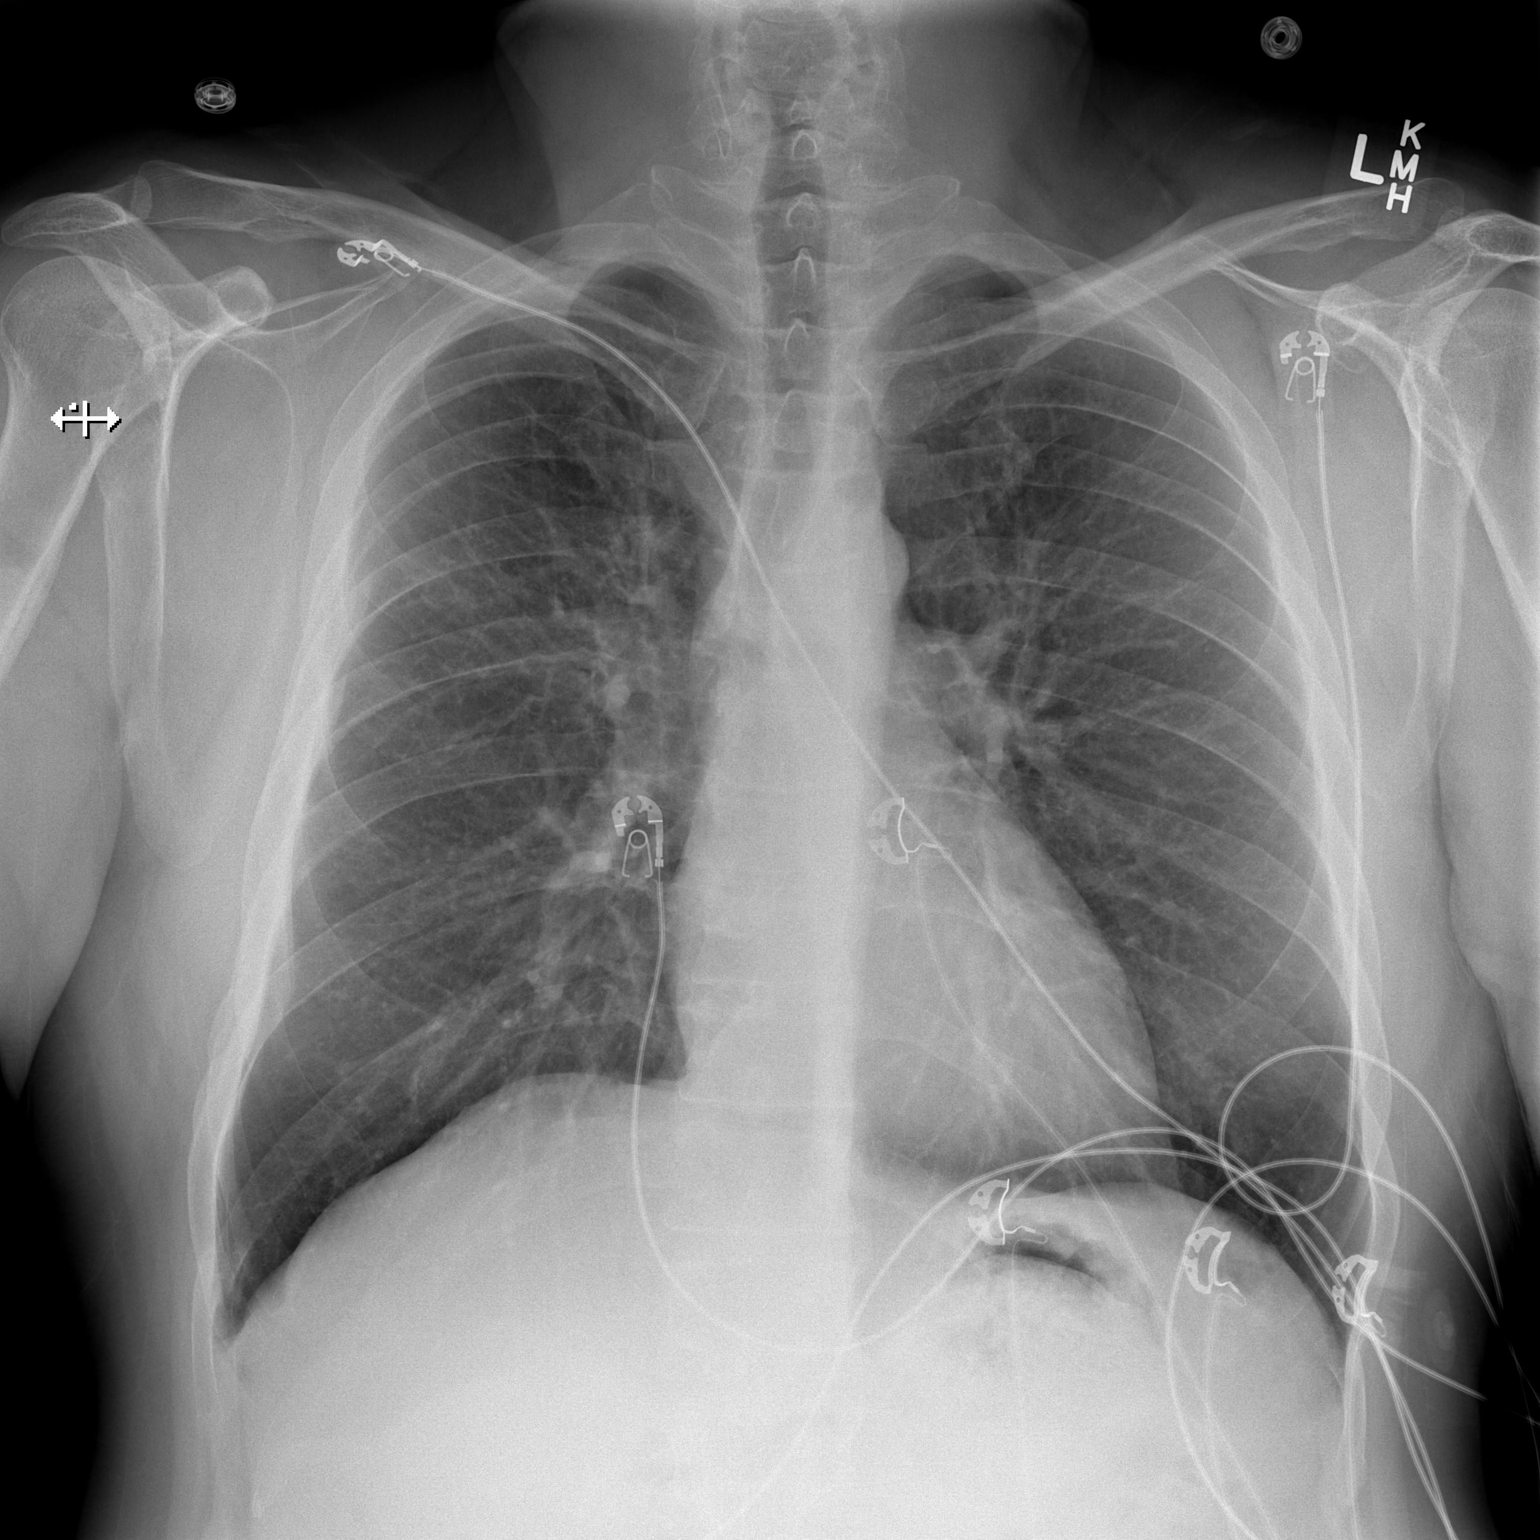

[w chest lat]
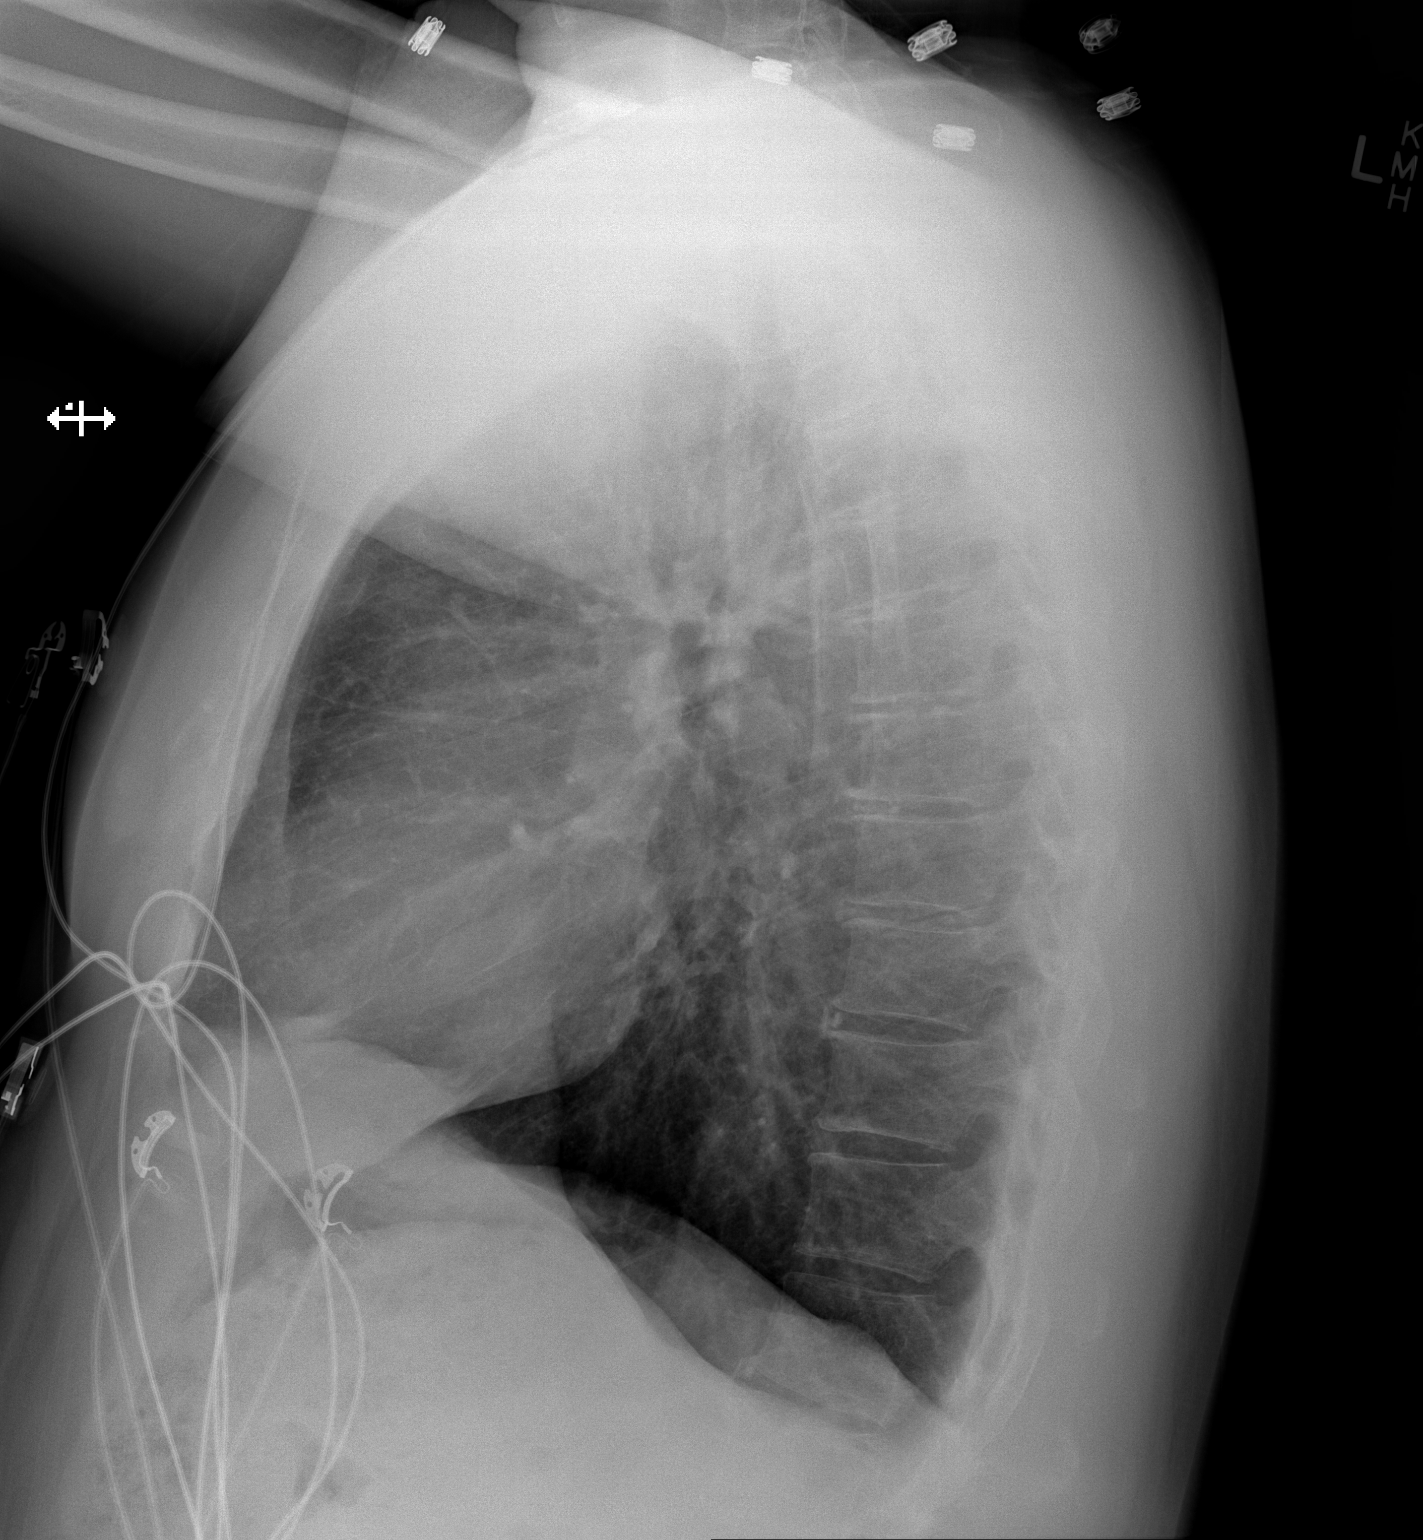

[2 of 2 positions shown; findings below may reference images not displayed]

FINDINGS: The lungs are clear. Heart size is normal. No pneumothorax or
pleural effusion. Remote right rib fractures are noted.
IMPRESSION: No acute disease.
